# Patient Record
Sex: Female | Born: 1995 | Race: Black or African American | Hispanic: No | Marital: Single | Smoking: Never smoker
Health system: Southern US, Community
[De-identification: ages and names within clinical notes are randomized; demographics above are authoritative.]

## PROBLEM LIST (undated history)

## (undated) ENCOUNTER — Inpatient Hospital Stay (HOSPITAL_COMMUNITY): Payer: Self-pay

## (undated) DIAGNOSIS — B009 Herpesviral infection, unspecified: Secondary | ICD-10-CM

---
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
## 2018-03-18 ENCOUNTER — Encounter (HOSPITAL_COMMUNITY): Payer: Self-pay | Admitting: *Deleted

## 2018-03-18 ENCOUNTER — Other Ambulatory Visit: Payer: Self-pay

## 2018-03-18 ENCOUNTER — Inpatient Hospital Stay (HOSPITAL_COMMUNITY): Payer: BLUE CROSS/BLUE SHIELD

## 2018-03-18 ENCOUNTER — Inpatient Hospital Stay (HOSPITAL_COMMUNITY)
Admission: AD | Admit: 2018-03-18 | Discharge: 2018-03-18 | Disposition: A | Discharge status: Home / Self Care | Payer: BLUE CROSS/BLUE SHIELD | Source: Ambulatory Visit | Attending: Obstetrics and Gynecology | Admitting: Obstetrics and Gynecology

## 2018-03-18 DIAGNOSIS — O4691 Antepartum hemorrhage, unspecified, first trimester: Secondary | ICD-10-CM

## 2018-03-18 DIAGNOSIS — O3680X0 Pregnancy with inconclusive fetal viability, not applicable or unspecified: Secondary | ICD-10-CM

## 2018-03-18 DIAGNOSIS — O209 Hemorrhage in early pregnancy, unspecified: Secondary | ICD-10-CM | POA: Insufficient documentation

## 2018-03-18 DIAGNOSIS — Z3A01 Less than 8 weeks gestation of pregnancy: Secondary | ICD-10-CM | POA: Insufficient documentation

## 2018-03-18 DIAGNOSIS — O4690 Antepartum hemorrhage, unspecified, unspecified trimester: Secondary | ICD-10-CM

## 2018-03-18 DIAGNOSIS — Z6790 Unspecified blood type, Rh positive: Secondary | ICD-10-CM

## 2018-03-18 HISTORY — DX: Herpesviral infection, unspecified: B00.9

## 2018-03-18 LAB — WET PREP, GENITAL
SPERM: NONE SEEN
TRICH WET PREP: NONE SEEN
YEAST WET PREP: NONE SEEN

## 2018-03-18 LAB — CBC
HCT: 33.2 % — ABNORMAL LOW (ref 36.0–46.0)
HEMOGLOBIN: 10.6 g/dL — AB (ref 12.0–15.0)
MCH: 26 pg (ref 26.0–34.0)
MCHC: 31.9 g/dL (ref 30.0–36.0)
MCV: 81.6 fL (ref 78.0–100.0)
PLATELETS: 426 10*3/uL — AB (ref 150–400)
RBC: 4.07 MIL/uL (ref 3.87–5.11)
RDW: 15.8 % — ABNORMAL HIGH (ref 11.5–15.5)
WBC: 5.6 10*3/uL (ref 4.0–10.5)

## 2018-03-18 LAB — HCG, QUANTITATIVE, PREGNANCY: HCG, BETA CHAIN, QUANT, S: 294 m[IU]/mL — AB (ref <5)

## 2018-03-18 LAB — ABO/RH: ABO/RH(D): A POS

## 2018-03-18 NOTE — MAU Provider Note (Signed)
History     CSN: [id number]  Arrival date and time: 03/18/18 1334  Chief Complaint  Patient presents with  . Vaginal Bleeding   G1 @[redacted]w[redacted]d  by LMP sent from pregnancy care center d/t VB. Bleeding started about 5 days ago. She is using about 2 pads a day but only staining. Bleeding fluctuates b/w red, pink, and brown. Has a new partner 2 mos ago. No recent STD screen. 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 No pain.   OB History    Gravida  1   Para      Term      Preterm      AB      Living        SAB      TAB      Ectopic      Multiple      Live Births              Past Medical History:  Diagnosis Date  . HSV-2 (herpes simplex virus 2) infection 2014    History reviewed. No pertinent surgical history.  History reviewed. No pertinent family history.  Social History   Tobacco Use  . Smoking status: Never Smoker  . Smokeless tobacco: Never Used  Substance Use Topics  . Alcohol use: Not Currently    Comment: not in pregnancy  . Drug use: Yes    Types: Marijuana    Comment: last used last week    Allergies: No Known Allergies  No medications prior to admission.    Review of Systems  Gastrointestinal: Negative for abdominal pain.  Genitourinary: Positive for vaginal bleeding.   Physical Exam   Blood pressure 120/81, temperature 98.7 F (37.1 C), temperature source Oral, resp. rate 20, height 5' (1.524 m), weight 171 lb 12 oz (77.9 kg), last menstrual period 02/06/2018, SpO2 100 %.  Physical Exam  Constitutional: She is oriented to person, place, and time. She appears well-developed and well-nourished. No distress.  HENT:  Head: Normocephalic and atraumatic.  Neck: Normal range of motion.  Cardiovascular: Normal rate.  Respiratory: Effort normal. No respiratory distress.  Genitourinary:  Genitourinary Comments: External: no lesions or erythema Vagina: rugated, pink, moist, scant red bloody discharge Uterus: non enlarged, anteverted, no tender, no  CMT Adnexae: no masses, no tenderness left, no tenderness right Cervix closed   Musculoskeletal: Normal range of motion.  Neurological: She is alert and oriented to person, place, and time.  Skin: Skin is warm and dry.  Psychiatric: She has a normal mood and affect.   Results for orders placed or performed during the hospital encounter of 03/18/18 (from the past 24 hour(s))  ABO/Rh     Status: None (Preliminary result)   Collection Time: 03/18/18  3:07 PM  Result Value Ref Range   ABO/RH(D)      A POS Performed at [facility], [address] [place], [place] [address]   CBC     Status: Abnormal   Collection Time: 03/18/18  3:07 PM  Result Value Ref Range   WBC 5.6 4.0 - 10.5 K/uL   RBC 4.07 3.87 - 5.11 MIL/uL   Hemoglobin 10.6 (L) 12.0 - 15.0 g/dL   HCT 62.133.2 (L) 30.836.0 - 65.746.0 %   MCV 81.6 78.0 - 100.0 fL   MCH 26.0 26.0 - 34.0 pg   MCHC 31.9 30.0 - 36.0 g/dL   RDW 84.615.8 (H) 96.211.5 - 95.215.5 %   Platelets 426 (H) 150 - 400 K/uL  hCG, quantitative, pregnancy  Status: Abnormal   Collection Time: 03/18/18  3:07 PM  Result Value Ref Range   hCG, Beta Chain, Quant, S 294 (H) <5 mIU/mL  Wet prep, genital     Status: Abnormal   Collection Time: 03/18/18  3:50 PM  Result Value Ref Range   Yeast Wet Prep HPF POC NONE SEEN NONE SEEN   Trich, Wet Prep NONE SEEN NONE SEEN   Clue Cells Wet Prep HPF POC PRESENT (A) NONE SEEN   WBC, Wet Prep HPF POC FEW (A) NONE SEEN   Sperm NONE SEEN    US Ob Less Than 14 Weeks With Ob Transvaginal  Result Date: 03/18/2018 CLINICAL DATA:  Vaginal bleeding. EXAM: OBSTETRIC <14 WK [place] AND TRANSVAGINAL OB US TECHNIQUE: Both transabdominal and transvaginal ultrasound examinations were performed for complete evaluation of the gestation as well as the maternal uterus, adnexal regions, and pelvic cul-de-sac. Transvaginal technique was performed to assess early pregnancy. COMPARISON:  None. FINDINGS: Intrauterine gestational sac: None Yolk sac:  Not Visualized.  Embryo:  Not Visualized. Cardiac Activity: Not Visualized. Maternal uterus/adnexae: Right ovary: Normal Left ovary: Normal Other :The endometrium appears heterogeneous with fluid and floating echogenic debris. This measures up to 12.7 cm. Free fluid:  Small IMPRESSION: 1. No intrauterine gestational sac, yolk sac, or fetal pole identified. Differential considerations include intrauterine pregnancy too early to be sonographically visualized, missed abortion, or ectopic pregnancy. Heterogeneous and thickened endometrium with floating echogenic debris suspicious for abortion in progress. Followup ultrasound is recommended in 10-14 days for further evaluation. 2. Small amount of free fluid noted within the pelvis. Electronically Signed   By: Signa Kell M.D.   On: 03/18/2018 16:31    MAU Course  Procedures  MDM Labs ordered and reviewed. No IUP or adnexal mass seen on [place], most likely SAB but findings could also indicate early pregnancy or ectopic pregnancy-discussed with pt. Will follow quant in 48 hrs. Stable for discharge home.  Assessment and Plan   1. Pregnancy, location unknown   2. Vaginal bleeding in pregnancy   3. Blood type, Rh positive    Discharge home Follow up in [facility] in 2 days for labs Return/ectopic precautions  Allergies as of 03/18/2018   No Known Allergies     Medication List    You have not been prescribed any medications.    Donette Larry, CNM 03/18/2018, 4:49 PM

## 2018-03-18 NOTE — Discharge Instructions (Signed)
Vaginal Bleeding During Pregnancy, First Trimester °A small amount of bleeding (spotting) from the vagina is common in early pregnancy. Sometimes the bleeding is normal and is not a problem, and sometimes it is a sign of something serious. Be sure to tell your doctor about any bleeding from your vagina right away. °Follow these instructions at home: °· Watch your condition for any changes. °· Follow your doctor's instructions about how active you can be. °· If you are on bed rest: °? You may need to stay in bed and only get up to use the bathroom. °? You may be allowed to do some activities. °? If you need help, make plans for someone to help you. °· Write down: °? The number of pads you use each day. °? How often you change pads. °? How soaked (saturated) your pads are. °· Do not use tampons. °· Do not douche. °· Do not have sex or orgasms until your doctor says it is okay. °· If you pass any tissue from your vagina, save the tissue so you can show it to your doctor. °· Only take medicines as told by your doctor. °· Do not take aspirin because it can make you bleed. °· Keep all follow-up visits as told by your doctor. °Contact a doctor if: °· You bleed from your vagina. °· You have cramps. °· You have labor pains. °· You have a fever that does not go away after you take medicine. °Get help right away if: °· You have very bad cramps in your back or belly (abdomen). °· You pass large clots or tissue from your vagina. °· You bleed more. °· You feel light-headed or weak. °· You pass out (faint). °· You have chills. °· You are leaking fluid or have a gush of fluid from your vagina. °· You pass out while pooping (having a bowel movement). °This information is not intended to replace advice given to you by your health care provider. Make sure you discuss any questions you have with your health care provider. °Document Released: 04/19/2014 Document Revised: 05/10/2016 Document Reviewed: 08/10/2013 °Elsevier Interactive  Patient Education © 2018 [employer] ° °

## 2018-03-18 NOTE — MAU Note (Addendum)
Pt presents with c/o intermittent VB that began Thursday, denies abdominal pain or cramping.  Reports [facility]. Pt has Proof of Pregnancy Verification f.orm LMP 02/06/18

## 2018-03-19 LAB — GC/CHLAMYDIA PROBE AMP (~~LOC~~) NOT AT ARMC
Chlamydia: NEGATIVE
NEISSERIA GONORRHEA: NEGATIVE

## 2018-03-20 ENCOUNTER — Ambulatory Visit (INDEPENDENT_AMBULATORY_CARE_PROVIDER_SITE_OTHER): Payer: BLUE CROSS/BLUE SHIELD

## 2018-03-20 ENCOUNTER — Encounter: Payer: Self-pay | Admitting: Family Medicine

## 2018-03-20 DIAGNOSIS — O3680X0 Pregnancy with inconclusive fetal viability, not applicable or unspecified: Secondary | ICD-10-CM

## 2018-03-20 DIAGNOSIS — O283 Abnormal ultrasonic finding on antenatal screening of mother: Secondary | ICD-10-CM

## 2018-03-20 LAB — HCG, QUANTITATIVE, PREGNANCY: hCG, Beta Chain, Quant, S: 96 m[IU]/mL — ABNORMAL HIGH (ref <5)

## 2018-03-20 NOTE — Progress Notes (Signed)
Pt here today for STAT Beta Lab.  Pt denies any sx.  Pt informed to wait in waiting room for duration of test which could last up to 2 hours.  Pt stated understanding.  Notified Dr. Vergie LivingPickens of pt beta results.  Per provider pt needs to have a rpt beta lab in two weeks.  Informed pt provider's recommendation.  Pt stated understanding with no further questions.

## 2018-03-21 NOTE — Progress Notes (Signed)
I have reviewed the chart and agree with nursing staff's documentation of this patient's encounter.  Elmore Bingharlie Cayton Cuevas, MD 03/21/2018 2:20 PM

## 2018-04-03 ENCOUNTER — Other Ambulatory Visit: Payer: BLUE CROSS/BLUE SHIELD

## 2018-06-06 ENCOUNTER — Ambulatory Visit
Admission: RE | Admit: 2018-06-06 | Discharge: 2018-06-06 | Disposition: A | Discharge status: Home / Self Care | Payer: No Typology Code available for payment source | Source: Ambulatory Visit | Attending: Internal Medicine | Admitting: Internal Medicine

## 2018-06-06 ENCOUNTER — Other Ambulatory Visit: Payer: Self-pay | Admitting: Internal Medicine

## 2018-06-06 DIAGNOSIS — R7611 Nonspecific reaction to tuberculin skin test without active tuberculosis: Secondary | ICD-10-CM

## 2018-06-11 ENCOUNTER — Ambulatory Visit: Payer: Self-pay | Admitting: Family Medicine

## 2018-06-11 VITALS — BP 120/80 | HR 65 | Temp 98.4°F | Resp 16 | Ht 60.0 in | Wt 173.0 lb

## 2018-06-11 DIAGNOSIS — Z0000 Encounter for general adult medical examination without abnormal findings: Secondary | ICD-10-CM

## 2018-06-11 NOTE — Progress Notes (Signed)
Brandy Ibarra is a 22 y.o. 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 She denies any chronic health conditions.  Review of Systems  Constitutional: Negative for chills, fever and malaise/fatigue.  HENT: Negative for congestion, ear discharge, ear pain, sinus pain and sore throat.   Eyes: Negative.   Respiratory: Negative for cough, sputum production and shortness of breath.   Cardiovascular: Negative.  Negative for chest pain.  Gastrointestinal: Negative for abdominal pain, diarrhea, nausea and vomiting.  Genitourinary: Negative for dysuria, frequency, hematuria and urgency.  Musculoskeletal: Negative for myalgias.  Skin: Negative.   Neurological: Negative for headaches.  Endo/Heme/Allergies: Negative.   Psychiatric/Behavioral: Negative.     O: Vitals:   06/11/18 1506  BP: 120/80  Pulse: 65  Resp: 16  Temp: 98.4 F (36.9 C)  SpO2: 98%     Physical Exam  Constitutional: She is oriented to person, place, and time. Vital signs are normal. She appears well-developed and well-nourished. She is active.  Non-toxic appearance. She does not have a sickly appearance.  HENT:  Head: Normocephalic.  Right Ear: Hearing, tympanic membrane, external ear and ear canal normal.  Left Ear: Hearing, tympanic membrane, external ear and ear canal normal.  Nose: Nose normal.  Mouth/Throat: Uvula is midline and oropharynx is clear and moist.  Neck: Normal range of motion. Neck supple.  Cardiovascular: Normal rate, regular rhythm, normal heart sounds and normal pulses.  Pulmonary/Chest: Effort normal and breath sounds normal.  Abdominal: Soft. Bowel sounds are normal.  Musculoskeletal: Normal range of motion.  Lymphadenopathy:       Head (right side): No submental and no submandibular adenopathy present.       Head (left side): No submental and no submandibular  adenopathy present.    She has no cervical adenopathy.  Neurological: She is alert and oriented to person, place, and time.  Psychiatric: She has a normal mood and affect. Her speech is normal and behavior is normal. Cognition and memory are normal.  PHQ-9- negative  Vitals reviewed.  A: 1. Physical exam    P: Exam findings, diagnosis etiology and medication use and indications reviewed with patient. Follow- Up and discharge instructions provided. No emergent/urgent issues found on exam.  Patient verbalized understanding of information provided and agrees with plan of care (POC), all questions answered.  1. Physical exam WNL- form filled out and returned a copy provided and attached to this record.

## 2018-06-11 NOTE — Patient Instructions (Signed)

## 2019-02-05 ENCOUNTER — Encounter (HOSPITAL_COMMUNITY): Payer: Self-pay

## 2019-09-21 IMAGING — US US OB < 14 WEEKS - US OB TV
1 series · 15 of 28 positions shown · non-contrast
Comparison: None.

CLINICAL DATA: Vaginal bleeding.

EXAM:
OBSTETRIC <14 WK US AND TRANSVAGINAL OB US
TECHNIQUE: Both transabdominal and transvaginal ultrasound examinations were
performed for complete evaluation of the gestation as well as the
maternal uterus, adnexal regions, and pelvic cul-de-sac.
Transvaginal technique was performed to assess early pregnancy.

[Series 1: us ob < 14 weeks - us ob tv · 44 acquisitions, 15 frames shown]
[im 1/44]
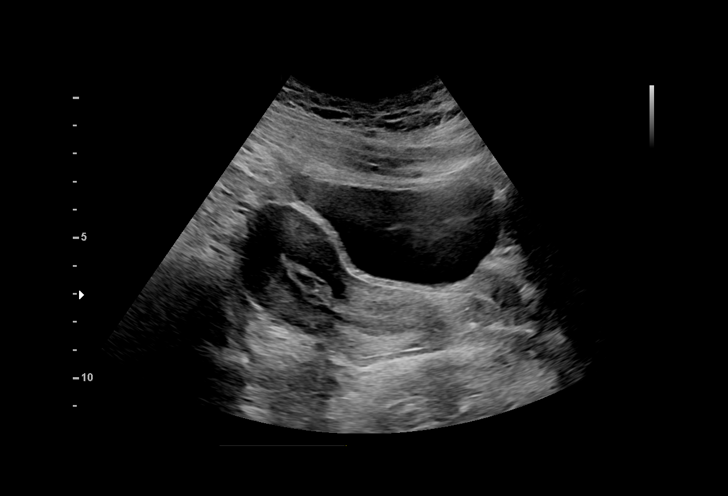
[im 4/44]
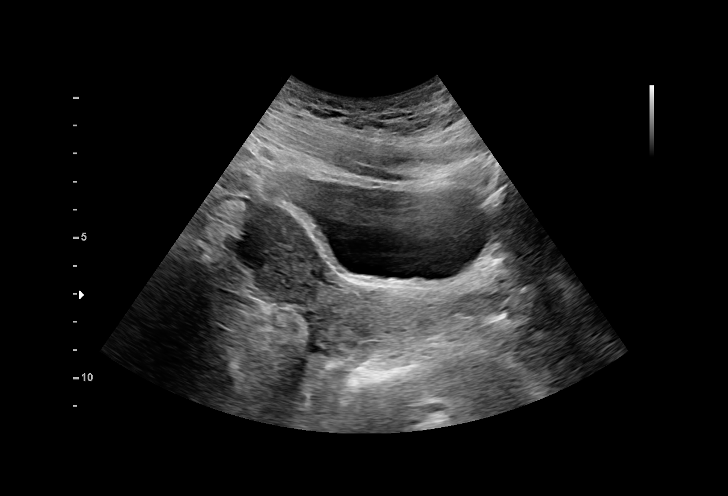
[im 7/44]
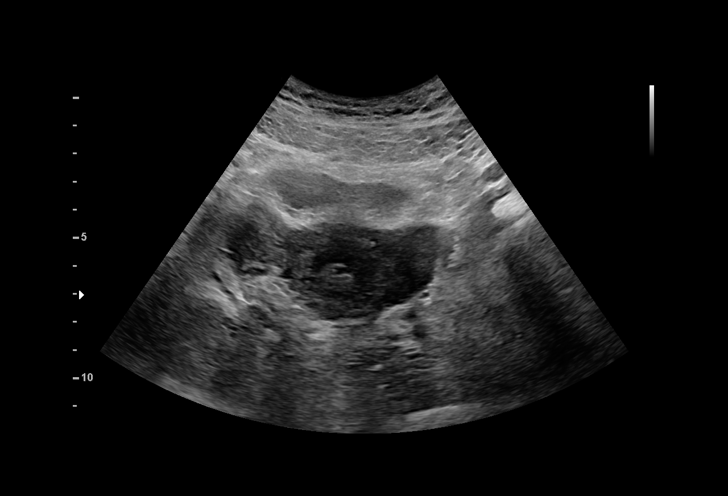
[im 10/44]
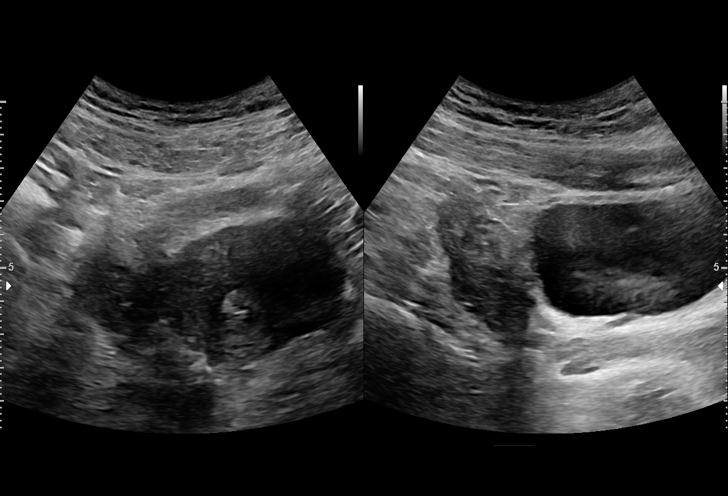
[im 13/44]
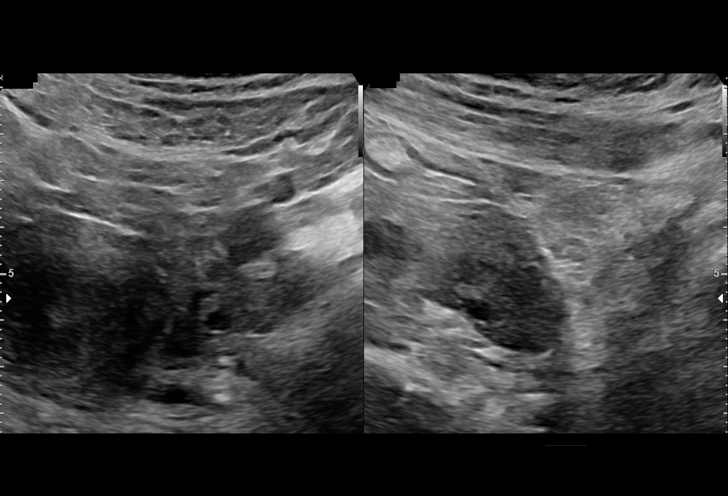
[im 16/44]
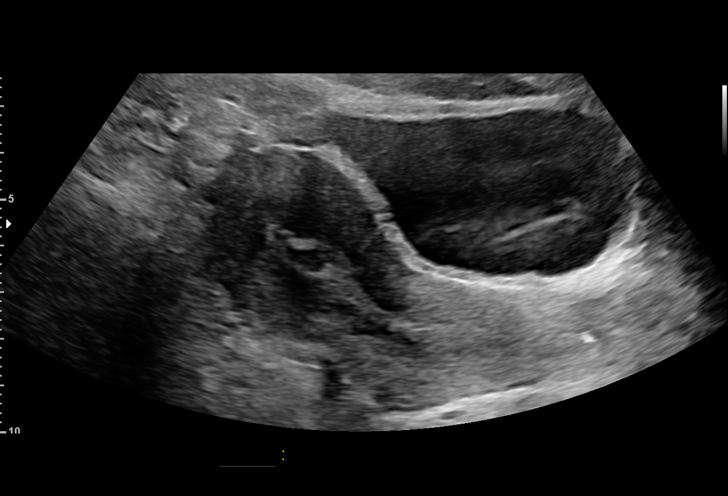
[im 20/44]
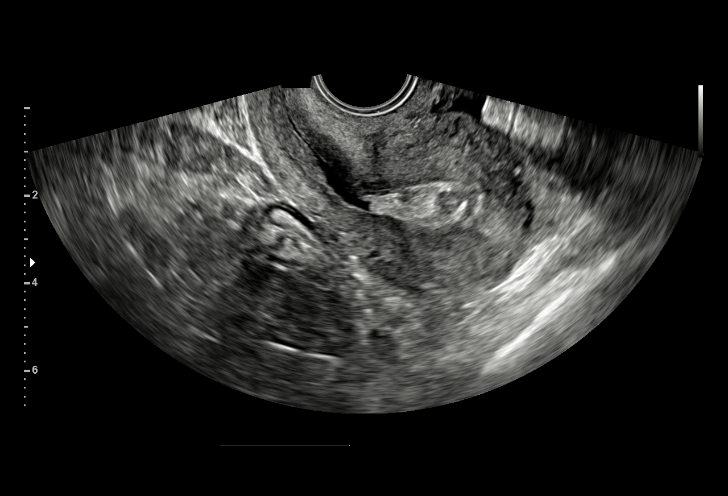
[im 23/44]
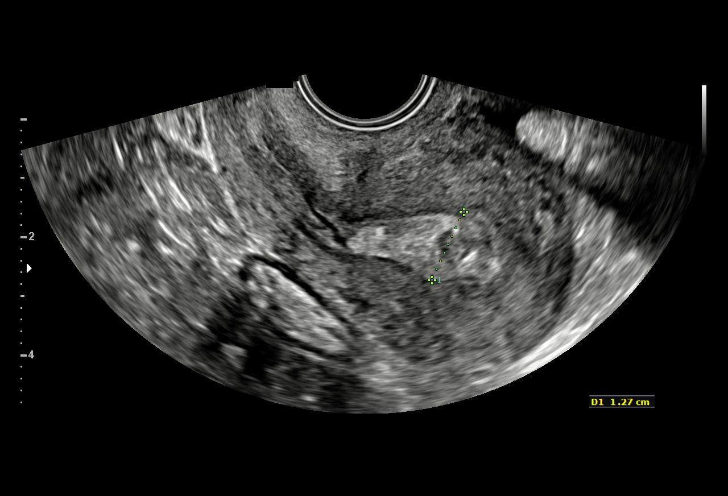
[im 24/44]
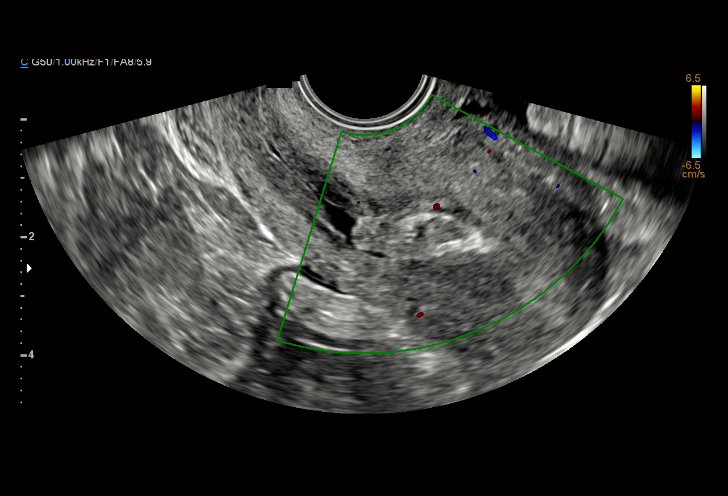
[im 28/44]
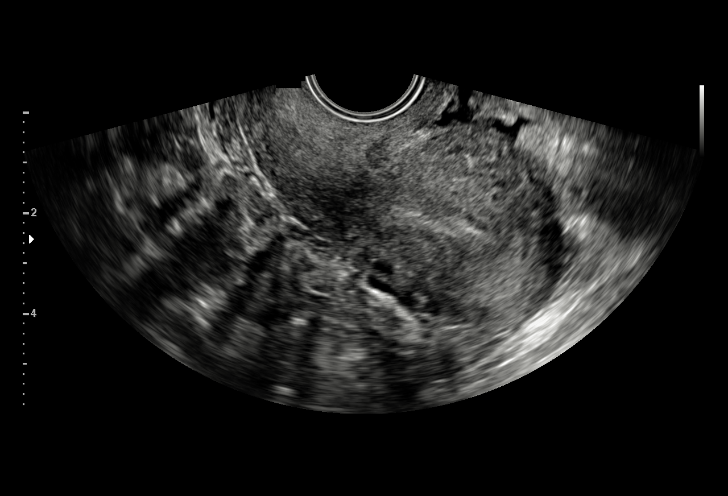
[im 31/44]
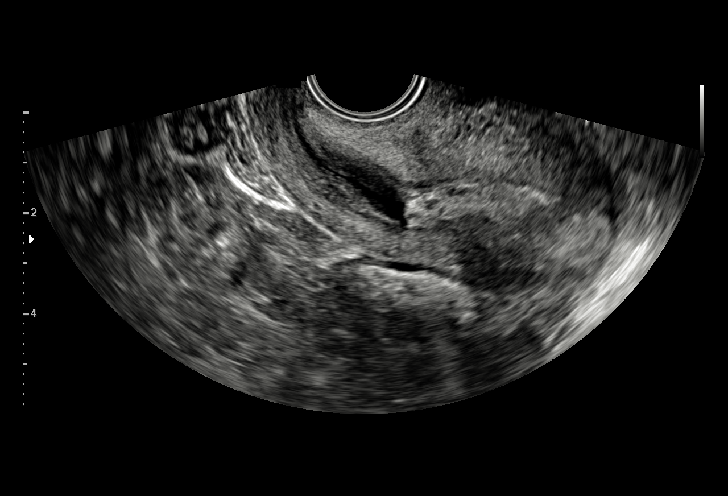
[im 34/44]
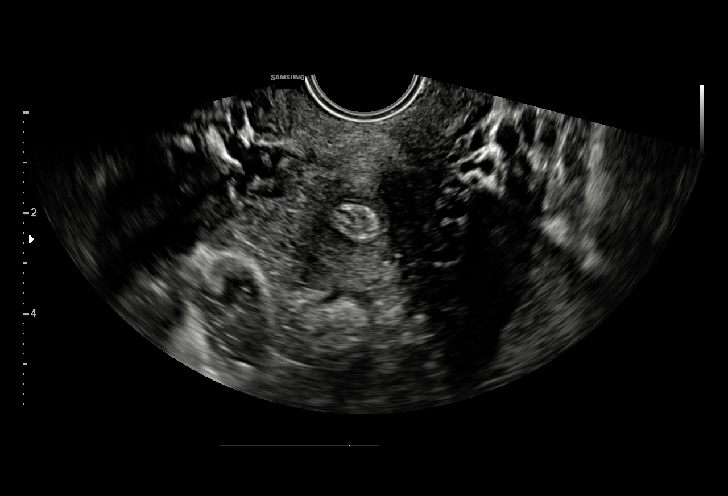
[im 37/44]
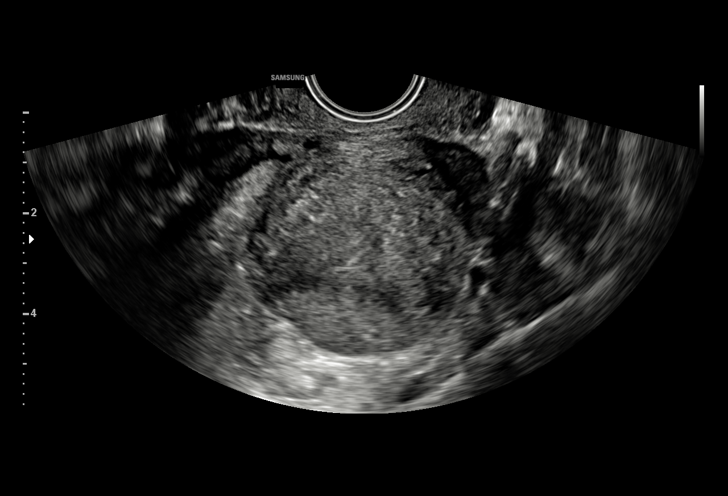
[im 40/44]
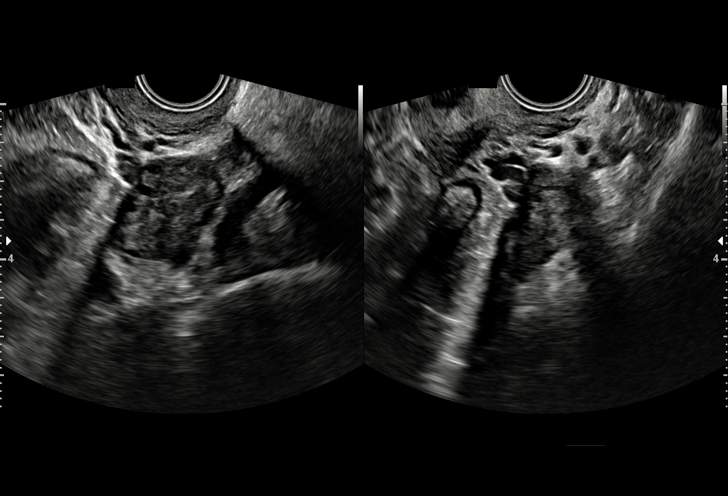
[im 44/44]
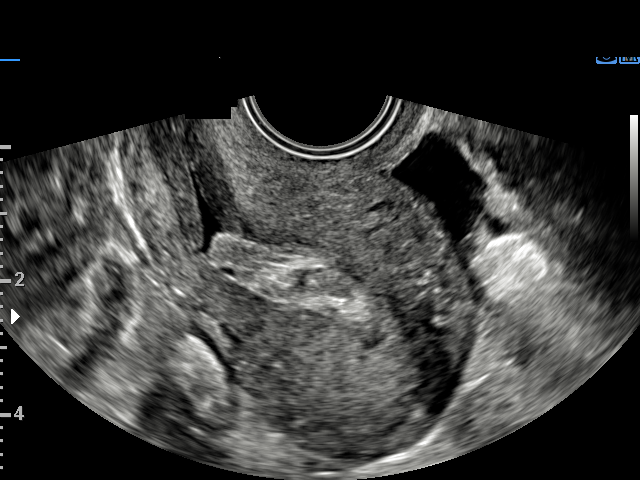

[15 of 28 positions shown; findings below may reference images not displayed]

FINDINGS: Intrauterine gestational sac: None

Yolk sac:  Not Visualized.

Embryo:  Not Visualized.

Cardiac Activity: Not Visualized.

Maternal uterus/adnexae:

Right ovary: Normal

Left ovary: Normal

Other :The endometrium appears heterogeneous with fluid and floating
echogenic debris. This measures up to 12.7 cm.

Free fluid:  Small
IMPRESSION: 1. No intrauterine gestational sac, yolk sac, or fetal pole
identified. Differential considerations include intrauterine
pregnancy too early to be sonographically visualized, missed
abortion, or ectopic pregnancy. Heterogeneous and thickened
endometrium with floating echogenic debris suspicious for abortion
in progress. Followup ultrasound is recommended in 10-14 days for
further evaluation.
2. Small amount of free fluid noted within the pelvis.

## 2019-12-10 IMAGING — CR DG CHEST 1V
1 series · 1 of 1 positions shown · non-contrast
Comparison: None.

CLINICAL DATA: Positive tuberculin skin test

EXAM:
CHEST  1 VIEW

[w chest pa]
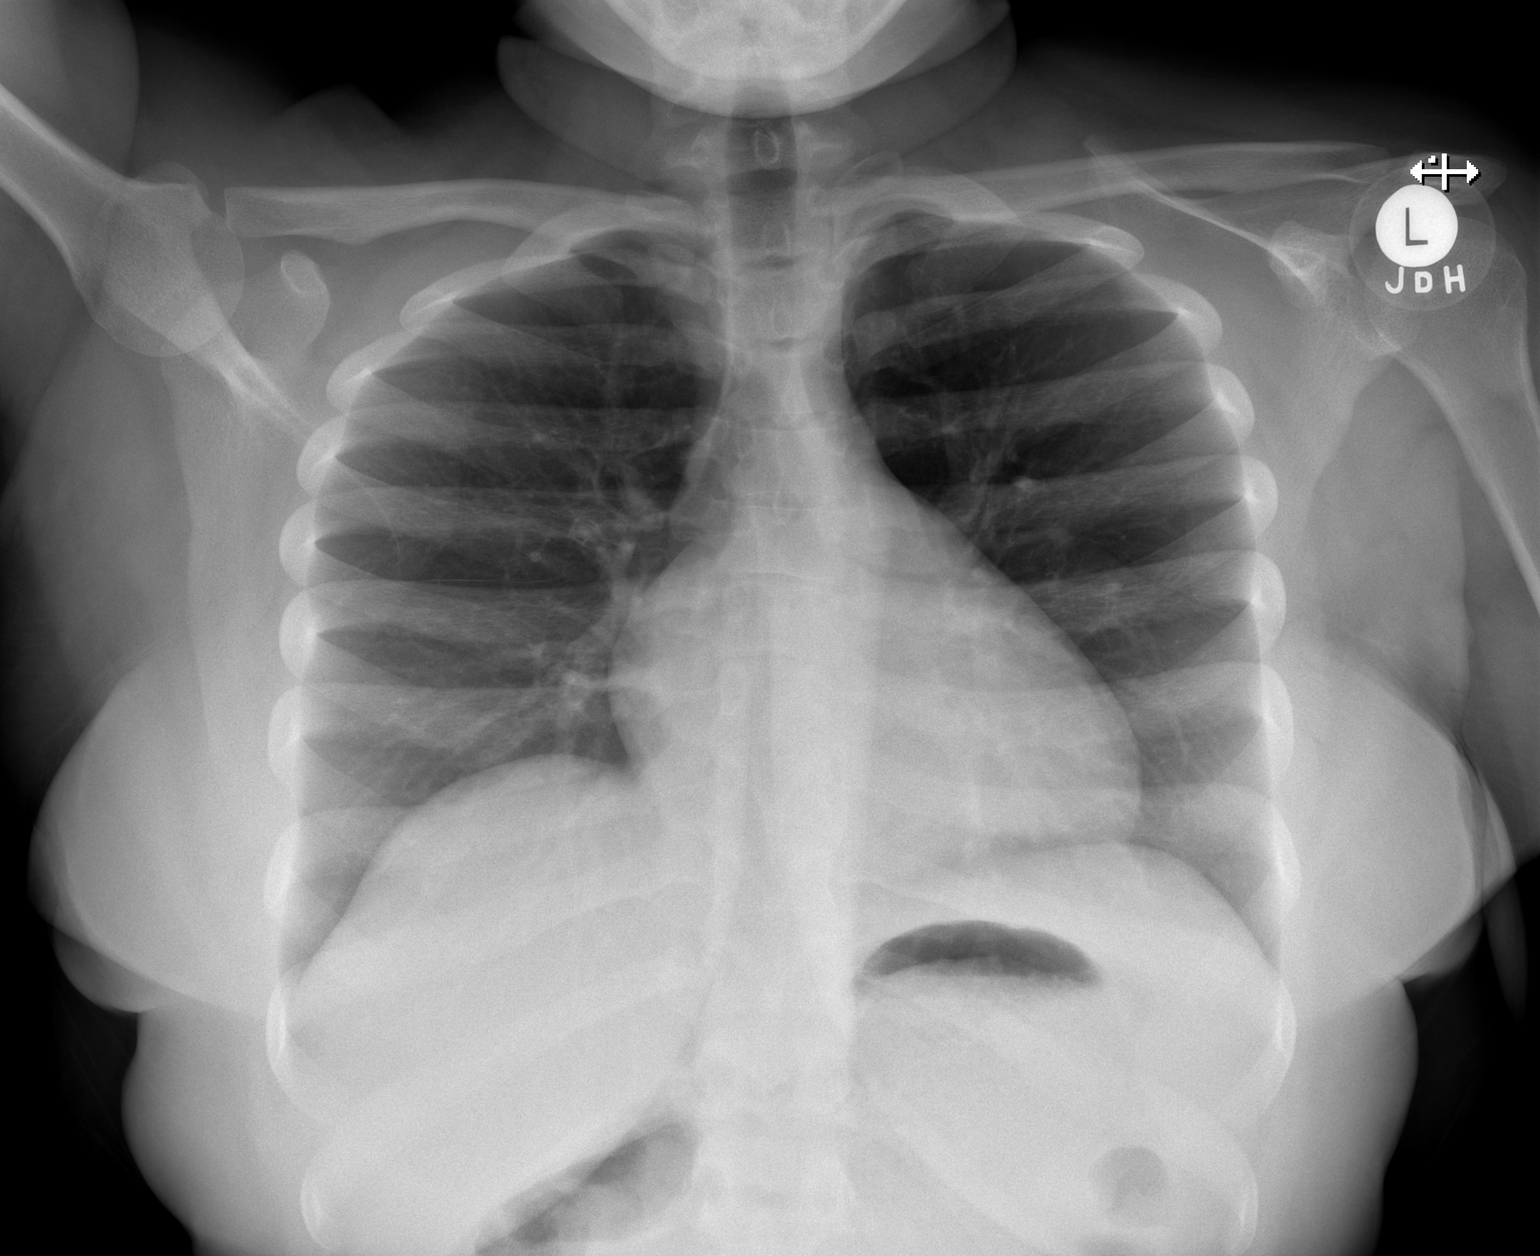

[1 of 1 positions shown; findings below may reference images not displayed]

FINDINGS: Lungs are clear. The heart size and pulmonary vascularity are
normal. No adenopathy. No bone lesions.
IMPRESSION: No abnormality noted.
